# Patient Record
Sex: Female | Born: 1990 | Hispanic: Yes | Marital: Single
Health system: Southern US, Community
[De-identification: ages and names within clinical notes are randomized; demographics above are authoritative.]

---

## 2017-02-11 ENCOUNTER — Ambulatory Visit (HOSPITAL_COMMUNITY)
Admission: RE | Admit: 2017-02-11 | Discharge: 2017-02-11 | Disposition: A | Discharge status: Home / Self Care | Source: Ambulatory Visit | Attending: Chiropractic Medicine | Admitting: Chiropractic Medicine

## 2017-02-11 ENCOUNTER — Other Ambulatory Visit (HOSPITAL_COMMUNITY): Payer: Self-pay | Admitting: Chiropractic Medicine

## 2017-02-11 DIAGNOSIS — M549 Dorsalgia, unspecified: Secondary | ICD-10-CM | POA: Insufficient documentation

## 2017-02-11 DIAGNOSIS — M62838 Other muscle spasm: Secondary | ICD-10-CM | POA: Insufficient documentation

## 2017-02-11 DIAGNOSIS — M546 Pain in thoracic spine: Secondary | ICD-10-CM

## 2017-02-11 DIAGNOSIS — M542 Cervicalgia: Secondary | ICD-10-CM | POA: Insufficient documentation

## 2017-02-11 DIAGNOSIS — M545 Low back pain: Secondary | ICD-10-CM

## 2017-02-11 DIAGNOSIS — M4185 Other forms of scoliosis, thoracolumbar region: Secondary | ICD-10-CM | POA: Insufficient documentation
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
## 2019-08-16 ENCOUNTER — Ambulatory Visit: Admitting: Family Medicine

## 2019-08-21 ENCOUNTER — Encounter: Payer: Self-pay | Admitting: Family Medicine

## 2019-08-21 ENCOUNTER — Other Ambulatory Visit: Payer: Self-pay

## 2019-08-21 ENCOUNTER — Ambulatory Visit (INDEPENDENT_AMBULATORY_CARE_PROVIDER_SITE_OTHER): Admitting: Family Medicine

## 2019-08-21 DIAGNOSIS — M546 Pain in thoracic spine: Secondary | ICD-10-CM
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
